# Patient Record
Sex: Male | Born: 2000 | Race: Black or African American | Hispanic: No | Marital: Single | Smoking: Never smoker
Health system: Southern US, Community
[De-identification: ages and names within clinical notes are randomized; demographics above are authoritative.]

---
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
## 2020-06-13 ENCOUNTER — Other Ambulatory Visit: Payer: Self-pay | Admitting: Physician Assistant

## 2020-06-13 ENCOUNTER — Other Ambulatory Visit: Payer: Self-pay | Admitting: Family Medicine

## 2020-06-13 DIAGNOSIS — I429 Cardiomyopathy, unspecified: Secondary | ICD-10-CM

## 2020-06-16 ENCOUNTER — Other Ambulatory Visit: Payer: Self-pay

## 2020-06-16 ENCOUNTER — Ambulatory Visit
Admission: RE | Admit: 2020-06-16 | Discharge: 2020-06-16 | Disposition: A | Discharge status: Home / Self Care | Payer: 59 | Source: Ambulatory Visit | Attending: Family Medicine | Admitting: Family Medicine

## 2020-06-16 DIAGNOSIS — I429 Cardiomyopathy, unspecified: Secondary | ICD-10-CM | POA: Diagnosis present

## 2020-06-16 LAB — ECHOCARDIOGRAM COMPLETE
AR max vel: 2.13 cm2
AV Area VTI: 2.08 cm2
AV Area mean vel: 2 cm2
AV Mean grad: 3 mmHg
AV Peak grad: 4.8 mmHg
Ao pk vel: 1.1 m/s
Area-P 1/2: 4.12 cm2
S' Lateral: 2.87 cm

## 2020-06-16 NOTE — Progress Notes (Signed)
*  PRELIMINARY RESULTS* Echocardiogram Echocardiogram Transesophageal has been performed.  Thomas Randolph 06/16/2020, 11:33 AM

## 2020-07-07 ENCOUNTER — Other Ambulatory Visit: Payer: Self-pay

## 2020-07-07 ENCOUNTER — Ambulatory Visit (INDEPENDENT_AMBULATORY_CARE_PROVIDER_SITE_OTHER): Payer: 59

## 2020-07-07 ENCOUNTER — Ambulatory Visit (INDEPENDENT_AMBULATORY_CARE_PROVIDER_SITE_OTHER): Payer: 59 | Admitting: Cardiology

## 2020-07-07 ENCOUNTER — Encounter: Payer: Self-pay | Admitting: Cardiology

## 2020-07-07 VITALS — BP 114/70 | HR 63 | Ht 72.0 in | Wt 170.0 lb

## 2020-07-07 DIAGNOSIS — R55 Syncope and collapse: Secondary | ICD-10-CM | POA: Diagnosis not present

## 2020-07-07 NOTE — Patient Instructions (Signed)
Medication Instructions:   Your physician recommends that you continue on your current medications as directed. Please refer to the Current Medication list given to you today.  *If you need a refill on your cardiac medications before your next appointment, please call your pharmacy*   Lab Work: None ordered If you have labs (blood work) drawn today and your tests are completely normal, you will receive your results only by: Marland Kitchen MyChart Message (if you have MyChart) OR . A paper copy in the mail If you have any lab test that is abnormal or we need to change your treatment, we will call you to review the results.   Testing/Procedures:  Your physician has recommended that you wear a Zio monitor for 2 weeks This monitor is a medical device that records the heart's electrical activity. Doctors most often use these monitors to diagnose arrhythmias. Arrhythmias are problems with the speed or rhythm of the heartbeat. The monitor is a small device applied to your chest. You can wear one while you do your normal daily activities. While wearing this monitor if you have any symptoms to push the button and record what you felt. Once you have worn this monitor for the period of time provider prescribed (Usually 14 days), you will return the monitor device in the postage paid box. Once it is returned they will download the data collected and provide [place] with a report which the provider will then review and we will call you with those results. Important tips:  1. Avoid showering during the first 24 hours of wearing the monitor. 2. Avoid excessive sweating to help maximize wear time. 3. Do not submerge the device, no hot tubs, and no swimming pools. 4. Keep any lotions or oils away from the patch. 5. After 24 hours you may shower with the patch on. Take brief showers with your back facing the shower head.  6. Do not remove patch once it has been placed because that will interrupt data and decrease adhesive wear  time. 7. Push the button when you have any symptoms and write down what you were feeling. 8. Once you have completed wearing your monitor, remove and place into box which has postage paid and place in your outgoing mailbox.  9. If for some reason you have misplaced your box then call our office and we can provide another box and/or mail it off for you.         Follow-Up: At [facility], you and your health needs are our priority.  As part of our continuing mission to provide you with exceptional heart care, we have created designated Provider Care Teams.  These Care Teams include your primary Cardiologist (physician) and Advanced Practice Providers (APPs -  Physician Assistants and Nurse Practitioners) who all work together to provide you with the care you need, when you need it.  We recommend signing up for the patient portal called "MyChart".  Sign up information is provided on this After Visit Summary.  MyChart is used to connect with patients for Virtual Visits (Telemedicine).  Patients are able to view lab/test results, encounter notes, upcoming appointments, etc.  Non-urgent messages can be sent to your provider as well.   To learn more about what you can do with MyChart, go to [email or web address].    Your next appointment:   Follow up as needed   The format for your next appointment:   In Person  Provider:   Debbe Odea, MD   Other Instructions

## 2020-07-07 NOTE — Progress Notes (Signed)
Cardiology Office Note:    Date:  07/07/2020   ID:  Thomas Randolph, DOB March 03, 2001, MRN [id number]  PCP:  Mickie Bail, MD   [facility]  Cardiologist:  No primary care provider on file.  Advanced Practice Provider:  No care team member to display Electrophysiologist:  None       Referring MD: Christen Bame, PA   Chief Complaint  Patient presents with  . New Patient (Initial Visit)    ED follow up - Chest pain. Meds reviewed verbally with patient.    Thomas Randolph is a 20 y.o. male who is being seen today for the evaluation of chest pain at the request of Christen Bame, [place].   History of Present Illness:    Thomas Randolph is a 20 y.o. male with no significant past medical history who presents due to a syncopal event.  Patient states passing out last month while at home.  He just played basketball for about 2 hours, states not hydrating himself well.  Was sitting and try to get up to grab something when he felt dizzy and knew he was going to pass out.  He states bumping his head onto the wall and then fell.  States not losing consciousness, was aware about everything going on.  Was taken to the ED where work-up was unrevealing.  Diagnosed with vasovagal syncope and dehydration.  Has not had any further episodes since.  He denies any history of heart disease, palpitations, chest pain, shortness of breath.   Echocardiogram 06/16/2020 showed normal systolic and diastolic function, EF 55 to 60%.  History reviewed. No pertinent past medical history.  History reviewed. No pertinent surgical history.  Current Medications: No outpatient medications have been marked as taking for the 07/07/20 encounter (Office Visit) with Debbe Odea, MD.     Allergies:   Patient has no known allergies.   Social History   Socioeconomic History  . Marital status: Single    Spouse name: Not on file  . Number of children: Not on file  . Years of education: Not on file  .  Highest education level: Not on file  Occupational History  . Not on file  Tobacco Use  . Smoking status: Never Smoker  . Smokeless tobacco: Never Used  Substance and Sexual Activity  . Alcohol use: No    Alcohol/week: 0.0 standard drinks  . Drug use: Yes    Types: Marijuana  . Sexual activity: Not on file  Other Topics Concern  . Not on file  Social History Narrative  . Not on file   Social Determinants of Health   Financial Resource Strain: Not on file  Food Insecurity: Not on file  Transportation Needs: Not on file  Physical Activity: Not on file  Stress: Not on file  Social Connections: Not on file     Family History: The patient's family history is not on file.  ROS:   Please see the history of present illness.     All other systems reviewed and are negative.  EKGs/Labs/Other Studies Reviewed:    The following studies were reviewed today:   EKG:  EKG is  ordered today.  The ekg ordered today demonstrates normal sinus rhythm  Recent Labs: No results found for requested labs within last 8760 hours.  Recent Lipid Panel No results found for: CHOL, TRIG, HDL, CHOLHDL, VLDL, LDLCALC, LDLDIRECT   Risk Assessment/Calculations:      Physical Exam:    VS:  BP 114/70 (BP Location: Left Arm, Patient Position: Sitting, Cuff Size: Normal)   Pulse 63   Ht 6' (1.829 m)   Wt 170 lb (77.1 kg)   SpO2 98%   BMI 23.06 kg/m     Wt Readings from Last 3 Encounters:  07/07/20 170 lb (77.1 kg)  09/09/17 165 lb (74.8 kg) (73 %, Z= 0.60)*  11/12/15 153 lb (69.4 kg) (73 %, Z= 0.61)*   * Growth percentiles are based on CDC (Boys, 2-20 Years) data.     GEN:  Well nourished, well developed in no acute distress HEENT: Normal NECK: No JVD; No carotid bruits LYMPHATICS: No lymphadenopathy CARDIAC: RRR, no murmurs, rubs, gallops RESPIRATORY:  Clear to auscultation without rales, wheezing or rhonchi  ABDOMEN: Soft, non-tender, non-distended MUSCULOSKELETAL:  No edema; No  deformity  SKIN: Warm and dry NEUROLOGIC:  Alert and oriented x 3 PSYCHIATRIC:  Normal affect   ASSESSMENT:    1. Syncope and collapse    PLAN:    In order of problems listed above:  1. Patient with syncopal episode, symptoms likely secondary to vasovagal versus dehydration after playing basketball.  Echocardiogram obtained 06/16/2020 was normal with no structural abnormalities.  Has not had any further episodes.  We will place a 2-week cardiac monitor to evaluate any significant arrhythmias.  EKG did not reveal any significant arrhythmias, orthostatic vitals in the office today were negative for orthostasis.    If cardiac monitor is normal, patient will follow up as needed.  If there are any abnormalities, we will schedule a follow-up visit.       Medication Adjustments/Labs and Tests Ordered: Current medicines are reviewed at length with the patient today.  Concerns regarding medicines are outlined above.  Orders Placed This Encounter  Procedures  . LONG TERM MONITOR (3-14 DAYS)  . EKG 12-Lead   No orders of the defined types were placed in this encounter.   Patient Instructions  Medication Instructions:   Your physician recommends that you continue on your current medications as directed. Please refer to the Current Medication list given to you today.  *If you need a refill on your cardiac medications before your next appointment, please call your pharmacy*   Lab Work: None ordered If you have labs (blood work) drawn today and your tests are completely normal, you will receive your results only by: Marland Kitchen MyChart Message (if you have MyChart) OR . A paper copy in the mail If you have any lab test that is abnormal or we need to change your treatment, we will call you to review the results.   Testing/Procedures:  Your physician has recommended that you wear a Zio monitor for 2 weeks This monitor is a medical device that records the heart's electrical activity. Doctors most  often use these monitors to diagnose arrhythmias. Arrhythmias are problems with the speed or rhythm of the heartbeat. The monitor is a small device applied to your chest. You can wear one while you do your normal daily activities. While wearing this monitor if you have any symptoms to push the button and record what you felt. Once you have worn this monitor for the period of time provider prescribed (Usually 14 days), you will return the monitor device in the postage paid box. Once it is returned they will download the data collected and provide [place] with a report which the provider will then review and we will call you with those results. Important tips:  1. Avoid showering during the first 24  hours of wearing the monitor. 2. Avoid excessive sweating to help maximize wear time. 3. Do not submerge the device, no hot tubs, and no swimming pools. 4. Keep any lotions or oils away from the patch. 5. After 24 hours you may shower with the patch on. Take brief showers with your back facing the shower head.  6. Do not remove patch once it has been placed because that will interrupt data and decrease adhesive wear time. 7. Push the button when you have any symptoms and write down what you were feeling. 8. Once you have completed wearing your monitor, remove and place into box which has postage paid and place in your outgoing mailbox.  9. If for some reason you have misplaced your box then call our office and we can provide another box and/or mail it off for you.         Follow-Up: At [facility], you and your health needs are our priority.  As part of our continuing mission to provide you with exceptional heart care, we have created designated Provider Care Teams.  These Care Teams include your primary Cardiologist (physician) and Advanced Practice Providers (APPs -  Physician Assistants and Nurse Practitioners) who all work together to provide you with the care you need, when you need it.  We  recommend signing up for the patient portal called "MyChart".  Sign up information is provided on this After Visit Summary.  MyChart is used to connect with patients for Virtual Visits (Telemedicine).  Patients are able to view lab/test results, encounter notes, upcoming appointments, etc.  Non-urgent messages can be sent to your provider as well.   To learn more about what you can do with MyChart, go to [email or web address].    Your next appointment:   Follow up as needed   The format for your next appointment:   In Person  Provider:   Debbe Odea, MD   Other Instructions     Signed, Debbe Odea, MD  07/07/2020 12:44 PM    [facility]

## 2020-08-03 ENCOUNTER — Telehealth: Payer: Self-pay

## 2020-08-03 NOTE — Telephone Encounter (Signed)
Margrett Rud, [place]  08/03/2020 12:00 PM EDT Back to Top     Attempted to call results to patient.  No answer. LMOV.  Phone note started.    Debbe Odea, MD  08/02/2020 4:54 PM EDT      No significant arrhythmias to explain syncopal episode. Syncope was likely due to dehydration from exercising/playing basketball. Follow-up as needed.

## 2021-03-22 ENCOUNTER — Encounter: Payer: Self-pay | Admitting: Emergency Medicine

## 2021-03-22 ENCOUNTER — Ambulatory Visit
Admission: EM | Admit: 2021-03-22 | Discharge: 2021-03-22 | Disposition: A | Discharge status: Home / Self Care | Payer: 59

## 2021-03-22 DIAGNOSIS — B349 Viral infection, unspecified: Secondary | ICD-10-CM | POA: Diagnosis not present

## 2021-03-22 NOTE — Discharge Instructions (Addendum)
Your COVID and Flu tests are pending.  You should self quarantine until the test results are back.    Continue taking the Tamiflu.  Take Tylenol or ibuprofen as needed for fever or discomfort.  Rest and keep yourself hydrated.    Follow-up with your primary care provider if your symptoms are not improving.

## 2021-03-22 NOTE — ED Provider Notes (Signed)
Roderic Palau    CSN: [id number] Arrival date & time: 03/22/21  1709      History   Chief Complaint Chief Complaint  Patient presents with   Fever   Back Pain   Headache    HPI Thomas Randolph is a 20 y.o. male.  Accompanied by his mother, patient presents with fever, chills, headache, body aches, back pain.  T-max 101.  No rash, congestion, cough, shortness of breath, vomiting, diarrhea, dysuria, or other symptoms.  No falls or injury.  Patient had an e-visit 2 days ago and was prescribed Tamiflu; he has taken 3 doses.  He was seen at another clinic yesterday and was prescribed methocarbamol.  Additional treatment at home with NyQuil.  He denies other pertinent medical history.   The history is provided by the patient and a parent.   History reviewed. No pertinent past medical history.  There are no problems to display for this patient.   History reviewed. No pertinent surgical history.     Home Medications    Prior to Admission medications   Medication Sig Start Date End Date Taking? Authorizing Provider  methocarbamol (ROBAXIN) 500 MG tablet Take 500 mg by mouth 4 (four) times daily.   Yes [provider]  oseltamivir (TAMIFLU) 75 MG capsule Take 75 mg by mouth.   Yes [provider]    Family History History reviewed. No pertinent family history.  Social History Social History   Tobacco Use   Smoking status: Never   Smokeless tobacco: Never  Vaping Use   Vaping Use: Never used  Substance Use Topics   Alcohol use: No    Alcohol/week: 0.0 standard drinks   Drug use: Yes    Types: Marijuana     Allergies   Patient has no known allergies.   Review of Systems Review of Systems  Constitutional:  Positive for chills and fever.  HENT:  Negative for ear pain and sore throat.   Respiratory:  Negative for cough and shortness of breath.   Cardiovascular:  Negative for chest pain and palpitations.  Gastrointestinal:  Negative for  abdominal pain, diarrhea and vomiting.  Genitourinary:  Negative for dysuria, flank pain and hematuria.  Musculoskeletal:  Positive for back pain. Negative for arthralgias.  Skin:  Negative for color change and rash.  Neurological:  Positive for headaches. Negative for dizziness and syncope.  All other systems reviewed and are negative.   Physical Exam Triage Vital Signs ED Triage Vitals  Enc Vitals Group     BP 03/22/21 1721 123/78     Pulse Rate 03/22/21 1721 68     Resp 03/22/21 1721 18     Temp 03/22/21 1721 98.1 F (36.7 C)     Temp Source 03/22/21 1721 Oral     SpO2 03/22/21 1721 98 %     Weight --      Height --      Head Circumference --      Peak Flow --      Pain Score 03/22/21 1727 8     Pain Loc --      Pain Edu? --      Excl. in [place]? --    No data found.  Updated Vital Signs BP 123/78 (BP Location: Left Arm)   Pulse 68   Temp 98.1 F (36.7 C) (Oral)   Resp 18   SpO2 98%   Visual Acuity Right Eye Distance:   Left Eye Distance:   Bilateral Distance:  Right Eye Near:   Left Eye Near:    Bilateral Near:     Physical Exam Vitals and nursing note reviewed.  Constitutional:      General: He is not in acute distress.    Appearance: Normal appearance. He is well-developed. He is not ill-appearing.  HENT:     Right Ear: Tympanic membrane normal.     Left Ear: Tympanic membrane normal.     Nose: Nose normal.     Mouth/Throat:     Mouth: Mucous membranes are moist.     Pharynx: Oropharynx is clear.  Cardiovascular:     Rate and Rhythm: Normal rate and regular rhythm.     Heart sounds: Normal heart sounds.  Pulmonary:     Effort: Pulmonary effort is normal. No respiratory distress.     Breath sounds: Normal breath sounds.  Abdominal:     General: Bowel sounds are normal.     Palpations: Abdomen is soft.     Tenderness: There is no abdominal tenderness. There is no right CVA tenderness, left CVA tenderness, guarding or rebound.  Musculoskeletal:      Cervical back: Neck supple.  Skin:    General: Skin is warm and dry.  Neurological:     Mental Status: He is alert.  Psychiatric:        Mood and Affect: Mood normal.        Behavior: Behavior normal.     UC Treatments / Results  Labs (all labs ordered are listed, but only abnormal results are displayed) Labs Reviewed  COVID-19, FLU A+B NAA    EKG   Radiology No results found.  Procedures Procedures (including critical care time)  Medications Ordered in UC Medications - No data to display  Initial Impression / Assessment and Plan / UC Course  I have reviewed the triage vital signs and the nursing notes.  Pertinent labs & imaging results that were available during my care of the patient were reviewed by me and considered in my medical decision making (see chart for details).   Viral illness.  COVID and Flu pending.  Instructed patient to self quarantine per CDC guidelines.  Instructed him to continue the Tamiflu as directed until his test results are back.  Discussed symptomatic treatment including Tylenol or ibuprofen, rest, hydration.  Instructed patient to follow up with PCP if symptoms are not improving.  Patient agrees to plan of care.    Final Clinical Impressions(s) / UC Diagnoses   Final diagnoses:  Viral illness     Discharge Instructions      Your COVID and Flu tests are pending.  You should self quarantine until the test results are back.    Continue taking the Tamiflu.  Take Tylenol or ibuprofen as needed for fever or discomfort.  Rest and keep yourself hydrated.    Follow-up with your primary care provider if your symptoms are not improving.         ED Prescriptions   None    PDMP not reviewed this encounter.   Mickie Bail, NP 03/22/21 [phone]

## 2021-03-22 NOTE — ED Triage Notes (Signed)
Pt c/o lower back pain, fever, and HA x 3 days

## 2021-03-23 LAB — COVID-19, FLU A+B NAA
Influenza A, NAA: NOT DETECTED
Influenza B, NAA: NOT DETECTED
SARS-CoV-2, NAA: DETECTED — AB

## 2021-12-06 ENCOUNTER — Encounter: Payer: Self-pay | Admitting: *Deleted

## 2021-12-06 NOTE — Progress Notes (Signed)
Cardiology Office Note:    Date:  12/07/2021   ID:  Thomas Randolph, DOB [birth year], MRN [id number]  PCP:  Mickie Bail, MD  [facility] Cardiologist:  None  [facility] Electrophysiologist:  None   Referring MD: Mickie Bail, MD   Chief Complaint:  1 year follow-up  History of Present Illness:    Thomas Randolph is a 21 y.o. male with a hx of pre-syncope who presents for 1 year follow-up.   Patient was seen 06/2020 as a New patient for pre-syncope, passing out after playing basketball for 2 hours. Prior ER work-up was negative. Suspected vasovagal vs dehydration. Heart monitor was ordered. Heart monitor showed NSR with rare ectopy. Echo 06/16/20 showed normal LVEF and diastolic function, EF 55-60%.  The patient reports he is overall doing well. He has a boot on the left foot for plantar fascitis, he will wear it for another 2 weeks. The patient reports occasional chest pain, it is sharp and the left side. Can last a few minutes. This is new, it started a month ago. No pain with inspiration. This occurs mainly when he is running, but it's not consistent. Grandpa had open heart surgery in his 8s. He has occasional dizziness after exertion or practice.   History reviewed. No pertinent past medical history.  History reviewed. No pertinent surgical history.  Current Medications: Current Meds  Medication Sig   meloxicam (MOBIC) 15 MG tablet Take 1 tablet every day by oral route for 14 days.     Allergies:   Patient has no known allergies.   Social History   Socioeconomic History   Marital status: Single    Spouse name: Not on file   Number of children: Not on file   Years of education: Not on file   Highest education level: Not on file  Occupational History   Not on file  Tobacco Use   Smoking status: Never   Smokeless tobacco: Never  Vaping Use   Vaping Use: Never used  Substance and Sexual Activity   Alcohol use: No    Alcohol/week: 0.0 standard drinks of  alcohol   Drug use: Yes    Types: Marijuana   Sexual activity: Not on file  Other Topics Concern   Not on file  Social History Narrative   Not on file   Social Determinants of Health   Financial Resource Strain: Not on file  Food Insecurity: Not on file  Transportation Needs: Not on file  Physical Activity: Not on file  Stress: Not on file  Social Connections: Not on file     Family History: The patient's family history is not on file.  ROS:   Please see the history of present illness.     All other systems reviewed and are negative.  EKGs/Labs/Other Studies Reviewed:    The following studies were reviewed today:  Echo 06/2020  1. Left ventricular ejection fraction, by estimation, is 55 to 60%. The  left ventricle has normal function. The left ventricle has no regional  wall motion abnormalities. Left ventricular diastolic parameters were  normal. The average left ventricular  global longitudinal strain is -18.3 %. The global longitudinal strain is  normal.   2. Right ventricular systolic function is normal. The right ventricular  size is normal.   EKG:  EKG is  ordered today.  The ekg ordered today demonstrates NSR 67bpm, nonspecific T wave changes.  Recent Labs: No results found for requested labs within last 365 days.  Recent Lipid Panel No results found for: "CHOL", "TRIG", "HDL", "CHOLHDL", "VLDL", "LDLCALC", "LDLDIRECT"    Physical Exam:    VS:  BP 114/78 (BP Location: Left Arm, Patient Position: Sitting, Cuff Size: Normal)   Pulse 67   Ht 5\' 10"  (1.778 m)   Wt 168 lb (76.2 kg)   SpO2 98%   BMI 24.11 kg/m     Wt Readings from Last 3 Encounters:  12/07/21 168 lb (76.2 kg)  07/07/20 170 lb (77.1 kg)  09/09/17 165 lb (74.8 kg) (73 %, Z= 0.60)*   * Growth percentiles are based on CDC (Boys, 2-20 Years) data.     GEN:  Well nourished, well developed in no acute distress HEENT: Normal NECK: No JVD; No carotid bruits LYMPHATICS: No  lymphadenopathy CARDIAC: RRR, no murmurs, rubs, gallops RESPIRATORY:  Clear to auscultation without rales, wheezing or rhonchi  ABDOMEN: Soft, non-tender, non-distended MUSCULOSKELETAL:  No edema; No deformity  SKIN: Warm and dry NEUROLOGIC:  Alert and oriented x 3 PSYCHIATRIC:  Normal affect   ASSESSMENT:    1. Chest pain of uncertain etiology   2. Pre-syncope    PLAN:    In order of problems listed above:  Chest Pain Patient reports occasional sharp chest pain when he runs. It does not happen all the time. He is tender to palpation on exam. EKG with no changes. He is very active at baseline. He does report family history of CAD, otherwise no other RF. I suspect MSK. We did discuss treadmill stress test but he is in a boot for plantar fascitis. We will see him back in 3 months to evaluate symptoms and discuss further testing if needed.   Pre-syncope Patient has occasional dizziness after severe exertion. He takes breaks as needed.   Disposition: Follow up in 3 month(s) with MD/APP    Signed, Efrata Brunner 09/11/17, PA-C  12/07/2021 9:34 AM    [facility]

## 2021-12-07 ENCOUNTER — Encounter: Payer: Self-pay | Admitting: Medical

## 2021-12-07 ENCOUNTER — Ambulatory Visit (INDEPENDENT_AMBULATORY_CARE_PROVIDER_SITE_OTHER): Payer: 59 | Admitting: Medical

## 2021-12-07 VITALS — BP 114/78 | HR 67 | Ht 70.0 in | Wt 168.0 lb

## 2021-12-07 DIAGNOSIS — R55 Syncope and collapse: Secondary | ICD-10-CM

## 2021-12-07 DIAGNOSIS — R079 Chest pain, unspecified: Secondary | ICD-10-CM

## 2021-12-07 NOTE — Patient Instructions (Addendum)
Medication Instructions:  Your physician recommends that you continue on your current medications as directed. Please refer to the Current Medication list given to you today.  *If you need a refill on your cardiac medications before your next appointment, please call your pharmacy*   Lab Work: None ordered  If you have labs (blood work) drawn today and your tests are completely normal, you will receive your results only by: MyChart Message (if you have MyChart) OR A paper copy in the mail If you have any lab test that is abnormal or we need to change your treatment, we will call you to review the results.   Testing/Procedures: None ordered  Follow-Up: At [facility], you and your health needs are our priority.  As part of our continuing mission to provide you with exceptional heart care, we have created designated Provider Care Teams.  These Care Teams include your primary Cardiologist (physician) and Advanced Practice Providers (APPs -  Physician Assistants and Nurse Practitioners) who all work together to provide you with the care you need, when you need it.  We recommend signing up for the patient portal called "MyChart".  Sign up information is provided on this After Visit Summary.  MyChart is used to connect with patients for Virtual Visits (Telemedicine).  Patients are able to view lab/test results, encounter notes, upcoming appointments, etc.  Non-urgent messages can be sent to your provider as well.   To learn more about what you can do with MyChart, go to [email or web address].    Your next appointment:   3 month(s)  The format for your next appointment:   In Person  Provider:   You may see Dr. Debbe Odea or one of the following Advanced Practice Providers on your designated Care Team:   Nicolasa Ducking, NP Eula Listen, PA-C Cadence Fransico Jovanie, PA-C{  Important Information About Sugar

## 2022-01-16 ENCOUNTER — Ambulatory Visit (INDEPENDENT_AMBULATORY_CARE_PROVIDER_SITE_OTHER): Payer: 59 | Admitting: Podiatry

## 2022-01-16 ENCOUNTER — Ambulatory Visit (INDEPENDENT_AMBULATORY_CARE_PROVIDER_SITE_OTHER): Payer: 59

## 2022-01-16 DIAGNOSIS — R937 Abnormal findings on diagnostic imaging of other parts of musculoskeletal system: Secondary | ICD-10-CM | POA: Diagnosis not present

## 2022-01-16 DIAGNOSIS — S99922A Unspecified injury of left foot, initial encounter: Secondary | ICD-10-CM

## 2022-01-16 DIAGNOSIS — M84375A Stress fracture, left foot, initial encounter for fracture: Secondary | ICD-10-CM | POA: Diagnosis not present

## 2022-01-16 MED ORDER — METHYLPREDNISOLONE 4 MG PO TBPK: ORAL_TABLET | ORAL | 0 refills | Status: DC

## 2022-01-17 ENCOUNTER — Telehealth: Payer: Self-pay | Admitting: Podiatry

## 2022-01-17 ENCOUNTER — Encounter: Payer: Self-pay | Admitting: Podiatry

## 2022-01-17 DIAGNOSIS — R937 Abnormal findings on diagnostic imaging of other parts of musculoskeletal system: Secondary | ICD-10-CM

## 2022-01-17 DIAGNOSIS — M722 Plantar fascial fibromatosis: Secondary | ICD-10-CM

## 2022-01-17 NOTE — Telephone Encounter (Signed)
Pts sister is calling to see if we can send over a prior authorization for MRI appt scheduled for tomorrow 10/6.   Please advise.

## 2022-01-17 NOTE — Progress Notes (Signed)
  Subjective:  Patient ID: Thomas Randolph, male    DOB: July 01, 2000,  MRN: [id number]  Chief Complaint  Patient presents with   Foot Pain    Injured foot over the summer has been in the boot for about a month and a half    21 y.o. male presents with the above complaint. History confirmed with patient.  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  He felt a sharp pain in his foot and was evaluated by the trainer he ended up at [facility] and they evaluated him for heel pain and planter fasciitis.  Recommended immobilization and physical therapy in a boot.  He has not improved with this and an MRI was ordered.  He will get the CD to me today.  Objective:  Physical Exam: warm, good capillary refill, no trophic changes or ulcerative lesions, normal DP and PT pulses, normal sensory exam, and pain with lateral compression of the calcaneus, no direct pain on the insertion of the plantar fascia or mid band of the plantar fascia, no pain in the Achilles, no ecchymosis bruising or discontinuity of tendons   Radiographs: Multiple views x-ray of the left foot: Radiographs taken today show no acute fracture or dislocation, in the area of Kager's triangle there is some radiolucency  MRI at [facility] 01/02/2022 CONCLUSION: 1. Ill-defined thickened anterior talofibular ligament, calcaneofibular ligament and less so deltoid ligament. Sprain, scarring and capsulitis are present. Clinical correlation will be necessary to assess and confirm functional stability. 2. Mild Achilles tendon tendinopathy and peritendinitis. No tear. 3. Edema changes within the mid and posteromedial talus. Low-grade stress change or contusion present. 4. No fracture, AVN or mass. Thank you for the opportunity to provide your interpretation. Libby Maw, MD AJannifer Rodney 01/02/2022 2:09 PM  Assessment:   1. Bone marrow edema   2. Foot injury, left,  initial encounter   3. Stress fracture of left calcaneus      Plan:  Patient was evaluated and treated and all questions answered.  I evaluated and treated the patient, we reviewed his radiographs and his progress.  Initially I agreed with the read on the MRI report discussed that likely it is resolving plantar fasciitis and his treatment plan so far.  He was able to get me the MRI CD today and I was able to independently review it.  I do think that on the axial views there is a small discontinuity in the wall of the calcaneus and an area of hyperintensity on the T2 signal I suspect this is likely a stress fracture developing into a full fracture.  I called him to messages and I recommend we follow-up with a CT scan that has better resolution the current MRI is rather poor resolution.  For now advised avoid impact activity and hold off on practice for now continue WBAT in boot I will let him know further treatment options once I have the CT scan completed.  Rx for methylprednisolone sent to pharmacy for anti-inflammatory, he will hold off on using this until we have the CT scan  Return in about 4 weeks (around 02/13/2022) for follow up left foot heel pain.

## 2022-01-18 ENCOUNTER — Other Ambulatory Visit: Payer: 59

## 2022-01-21 ENCOUNTER — Telehealth: Payer: Self-pay | Admitting: *Deleted

## 2022-01-21 NOTE — Telephone Encounter (Signed)
Patient's mother is calling for status of the CT scan approval,please contact mother once approved.

## 2022-01-23 ENCOUNTER — Ambulatory Visit
Admission: RE | Admit: 2022-01-23 | Discharge: 2022-01-23 | Disposition: A | Discharge status: Home / Self Care | Payer: 59 | Source: Ambulatory Visit | Attending: Podiatry | Admitting: Podiatry

## 2022-01-23 DIAGNOSIS — M84375A Stress fracture, left foot, initial encounter for fracture: Secondary | ICD-10-CM

## 2022-01-23 DIAGNOSIS — R937 Abnormal findings on diagnostic imaging of other parts of musculoskeletal system: Secondary | ICD-10-CM

## 2022-01-24 NOTE — Telephone Encounter (Signed)
Patient MRI was completed yesterday.

## 2022-01-29 ENCOUNTER — Encounter: Payer: Self-pay | Admitting: Podiatry

## 2022-01-30 ENCOUNTER — Telehealth: Payer: Self-pay | Admitting: Podiatry

## 2022-01-30 NOTE — Telephone Encounter (Signed)
Called the patient to review results of CT scan.  I also discussed with Dr. Genene Churn with radiology.  Recommendations noted in the imaging section

## 2022-02-04 ENCOUNTER — Encounter: Payer: Self-pay | Admitting: Podiatry

## 2022-02-04 ENCOUNTER — Ambulatory Visit (INDEPENDENT_AMBULATORY_CARE_PROVIDER_SITE_OTHER): Payer: 59 | Admitting: Podiatry

## 2022-02-04 DIAGNOSIS — M722 Plantar fascial fibromatosis: Secondary | ICD-10-CM | POA: Diagnosis not present

## 2022-02-04 NOTE — Progress Notes (Signed)
  Subjective:  Patient ID: Thomas Randolph, male    DOB: 12-07-2000,  MRN: [id number]  Chief Complaint  Patient presents with   Foot Pain      follow up 4 weeks left foot heel pain- Injection    21 y.o. male presents with the above complaint. History confirmed with patient.  Has not improved much with the methylprednisolone taper was helpful.  He is still taking meloxicam.  He completed the CT scan which we have previously discussed on the phone Objective:  Physical Exam: warm, good capillary refill, no trophic changes or ulcerative lesions, normal DP and PT pulses, normal sensory exam, and pain with lateral compression of the calcaneus, no direct pain on the insertion of the plantar fascia or mid band of the plantar fascia, no pain in the Achilles, no ecchymosis bruising or discontinuity of tendons   Radiographs: Multiple views x-ray of the left foot: Radiographs taken today show no acute fracture or dislocation, in the area of Kager's triangle there is some radiolucency  MRI at [facility] 01/02/2022 CONCLUSION: 1. Ill-defined thickened anterior talofibular ligament, calcaneofibular ligament and less so deltoid ligament. Sprain, scarring and capsulitis are present. Clinical correlation will be necessary to assess and confirm functional stability. 2. Mild Achilles tendon tendinopathy and peritendinitis. No tear. 3. Edema changes within the mid and posteromedial talus. Low-grade stress change or contusion present. 4. No fracture, AVN or mass. Thank you for the opportunity to provide your interpretation. Libby Maw, MD A: Jannifer Rodney 01/02/2022 2:09 PM   CT scan 01/23/2022 IMPRESSION: 1. No evidence of acute fracture or dislocation. 2. Small osseous fragment about the inferior aspect of the medial malleolus, likely sequela of prior trauma. 3. Muscles and tendons are intact.     Electronically Signed   By: Keane Police D.O.   On: 01/23/2022 16:39 Assessment:   1. Plantar fasciitis of left  foot      Plan:  Patient was evaluated and treated and all questions answered.  We again reviewed his clinical progress.  I think at this point he should be able to continue with his planned season although he will continue to deal with this for some time.  He starts physical therapy this week and I think this will be very beneficial.  We did discuss and I recommended a corticosteroid injection to reduce inflammation and pain.  He will continue WBAT in the cam boot with the exception of practice and games.  Hopefully can transition out of this after the next visit.  Discussed the risk and benefits of the corticosteroid injection.  Following sterile prep with alcohol, 4 mg of dexamethasone and 0.5 cc each of 2% lidocaine and 0.5% Marcaine plain was injected along the medial and central band of the plantar fascia at the insertion on the calcaneus from medial approach.  He tolerated this well.  Return in about 30 days (around 03/06/2022) for recheck plantar fasciitis.

## 2022-02-13 ENCOUNTER — Ambulatory Visit: Payer: 59 | Admitting: Podiatry

## 2022-03-01 ENCOUNTER — Encounter: Payer: Self-pay | Admitting: Cardiology

## 2022-03-01 ENCOUNTER — Ambulatory Visit: Payer: 59 | Attending: Cardiology | Admitting: Cardiology

## 2022-03-01 VITALS — BP 104/60 | HR 64 | Ht 70.0 in | Wt 174.4 lb

## 2022-03-01 DIAGNOSIS — R55 Syncope and collapse: Secondary | ICD-10-CM

## 2022-03-01 DIAGNOSIS — F1290 Cannabis use, unspecified, uncomplicated: Secondary | ICD-10-CM

## 2022-03-01 NOTE — Progress Notes (Signed)
Cardiology Office Note:    Date:  03/01/2022   ID:  Thomas Randolph, DOB 07-09-2000, MRN [id number]  PCP:  Thomas Gauss, MD (Inactive)   [place]  Cardiologist:  None  Advanced Practice Provider:  No care team member to display Electrophysiologist:  None       Referring MD: Thomas Gauss, MD   Chief Complaint  Patient presents with   Follow-up    3 month f/u, no new cardiac concerns     History of Present Illness:    Thomas Randolph is a 21 y.o. male with no significant past medical history who presents for follow-up.  He was previously seen due to a syncopal event.    Previous echocardiogram showed normal systolic function, cardiac monitor normal showed no significant arrhythmias.  Patient denies any further episodes of syncope.  Previous syncopal event occurred in the context of playing basketball.  Etiology possibly dehydration or vasovagal.  Prior notes Echocardiogram 06/16/2020 showed normal systolic and diastolic function, EF 55 to 60%.  History reviewed. No pertinent past medical history.  History reviewed. No pertinent surgical history.  Current Medications: Current Meds  Medication Sig   methylPREDNISolone (MEDROL DOSEPAK) 4 MG TBPK tablet 6 day dose pack - take as directed     Allergies:   Patient has no known allergies.   Social History   Socioeconomic History   Marital status: Single    Spouse name: Not on file   Number of children: Not on file   Years of education: Not on file   Highest education level: Not on file  Occupational History   Not on file  Tobacco Use   Smoking status: Never   Smokeless tobacco: Never  Vaping Use   Vaping Use: Never used  Substance and Sexual Activity   Alcohol use: No    Alcohol/week: 0.0 standard drinks of alcohol   Drug use: Yes    Types: Marijuana   Sexual activity: Not on file  Other Topics Concern   Not on file  Social History Narrative   Not on file   Social  Determinants of Health   Financial Resource Strain: Not on file  Food Insecurity: Not on file  Transportation Needs: Not on file  Physical Activity: Not on file  Stress: Not on file  Social Connections: Not on file     Family History: The patient's family history is not on file.  ROS:   Please see the history of present illness.     All other systems reviewed and are negative.  EKGs/Labs/Other Studies Reviewed:    The following studies were reviewed today:   EKG:  EKG not ordered today.    Recent Labs: No results found for requested labs within last 365 days.  Recent Lipid Panel No results found for: "CHOL", "TRIG", "HDL", "CHOLHDL", "VLDL", "LDLCALC", "LDLDIRECT"   Risk Assessment/Calculations:      Physical Exam:    VS:  BP 104/60 (BP Location: Left Arm, Patient Position: Sitting, Cuff Size: Normal)   Pulse 64   Ht 5\' 10"  (1.778 m)   Wt 174 lb 6.4 oz (79.1 kg)   SpO2 100%   BMI 25.02 kg/m     Wt Readings from Last 3 Encounters:  03/01/22 174 lb 6.4 oz (79.1 kg)  12/07/21 168 lb (76.2 kg)  07/07/20 170 lb (77.1 kg)     GEN:  Well nourished, well developed in no acute distress HEENT: Normal NECK: No JVD; No  carotid bruits CARDIAC: RRR, no murmurs, rubs, gallops RESPIRATORY:  Clear to auscultation without rales, wheezing or rhonchi  ABDOMEN: Soft, non-tender, non-distended MUSCULOSKELETAL:  No edema; No deformity  SKIN: Warm and dry NEUROLOGIC:  Alert and oriented x 3 PSYCHIATRIC:  Normal affect   ASSESSMENT:    1. Syncope and collapse   2. Marijuana smoker    PLAN:    In order of problems listed above:  Patient with syncopal episode, symptoms likely secondary to vasovagal versus dehydration after playing basketball.  Echocardiogram obtained 06/16/2020 was normal with no structural abnormalities.  Previous orthostatic vitals with no evidence for orthostasis.  Cardiac monitor unrevealing, no significant arrhythmias. Marijuana smoking, cessation  advised.  Follow-up as needed  Medication Adjustments/Labs and Tests Ordered: Current medicines are reviewed at length with the patient today.  Concerns regarding medicines are outlined above.  No orders of the defined types were placed in this encounter.  No orders of the defined types were placed in this encounter.   There are no Patient Instructions on file for this visit.   Signed, Debbe Odea, MD  03/01/2022 9:38 AM    [facility]

## 2022-03-01 NOTE — Patient Instructions (Signed)
Medication Instructions:   Your physician recommends that you continue on your current medications as directed. Please refer to the Current Medication list given to you today.   *If you need a refill on your cardiac medications before your next appointment, please call your pharmacy*    Follow-Up: At [facility], you and your health needs are our priority.  As part of our continuing mission to provide you with exceptional heart care, we have created designated Provider Care Teams.  These Care Teams include your primary Cardiologist (physician) and Advanced Practice Providers (APPs -  Physician Assistants and Nurse Practitioners) who all work together to provide you with the care you need, when you need it.  We recommend signing up for the patient portal called "MyChart".  Sign up information is provided on this After Visit Summary.  MyChart is used to connect with patients for Virtual Visits (Telemedicine).  Patients are able to view lab/test results, encounter notes, upcoming appointments, etc.  Non-urgent messages can be sent to your provider as well.   To learn more about what you can do with MyChart, go to [email or web address].    Your next appointment:   Follow up as needed   The format for your next appointment:   In Person  Provider:   You may see Brian Agbor-Etang, MD or one of the following Advanced Practice Providers on your designated Care Team:   Christopher Berge, NP Ryan Dunn, PA-C Cadence Furth, PA-C Sheri Hammock, NP    Other Instructions   Important Information About Sugar       

## 2022-03-06 ENCOUNTER — Encounter: Payer: Self-pay | Admitting: Podiatry

## 2022-03-06 ENCOUNTER — Ambulatory Visit (INDEPENDENT_AMBULATORY_CARE_PROVIDER_SITE_OTHER): Payer: 59 | Admitting: Podiatry

## 2022-03-06 DIAGNOSIS — M25372 Other instability, left ankle: Secondary | ICD-10-CM | POA: Diagnosis not present

## 2022-03-06 DIAGNOSIS — M722 Plantar fascial fibromatosis: Secondary | ICD-10-CM | POA: Diagnosis not present

## 2022-03-06 NOTE — Progress Notes (Signed)
  Subjective:  Patient ID: Thomas Randolph, male    DOB: 21-Mar-2001,  MRN: [id number]  Chief Complaint  Patient presents with   Plantar Fasciitis    Follow up left foot    21 y.o. male presents with the above complaint. History confirmed with patient.  He is doing much better he says he is not having any pain.  The injection was very helpful.  He has rolled his ankle a couple times he has had problems with ankle stability before.  The trainers are taping his ankles before the games.  Objective:  Physical Exam: warm, good capillary refill, no trophic changes or ulcerative lesions, normal DP and PT pulses, normal sensory exam, no pain on compression of the heels today.  He has mild tenderness over the CFL, no instability no talar tilt or anterior drawer noted no ecchymosis edema or bruising the lateral ankle  Radiographs: Multiple views x-ray of the left foot: Radiographs taken today show no acute fracture or dislocation, in the area of Kager's triangle there is some radiolucency  MRI at [facility] 01/02/2022 CONCLUSION: 1. Ill-defined thickened anterior talofibular ligament, calcaneofibular ligament and less so deltoid ligament. Sprain, scarring and capsulitis are present. Clinical correlation will be necessary to assess and confirm functional stability. 2. Mild Achilles tendon tendinopathy and peritendinitis. No tear. 3. Edema changes within the mid and posteromedial talus. Low-grade stress change or contusion present. 4. No fracture, AVN or mass. Thank you for the opportunity to provide your interpretation. Laqueta Due, MD A: Thea Silversmith 01/02/2022 2:09 PM   CT scan 01/23/2022 IMPRESSION: 1. No evidence of acute fracture or dislocation. 2. Small osseous fragment about the inferior aspect of the medial malleolus, likely sequela of prior trauma. 3. Muscles and tendons are intact.     Electronically Signed   By: Larose Hires D.O.   On: 01/23/2022 16:39 Assessment:   1. Plantar fasciitis  of left foot   2. Ankle instability, left      Plan:  Patient was evaluated and treated and all questions answered.  Overall very well.  I think he can transition away from the cam boot back to regular supportive shoe gear.  I do agree with taping his ankles before games for stability.  I discussed physical therapy to treat this as well and he will continue this for his Planter fasciitis as well as the instability.  A secondary order to add to stabilization exercises was sent to [facility] in [place].  Home exercises were also given that he will do twice daily at home.  Hopefully should be able to continue his season at this point.  I will see him back in 2 months for follow-up Return in about 2 months (around 05/06/2022) for recheck plantar fasciitis, ankle stability .

## 2022-03-06 NOTE — Patient Instructions (Signed)
 Chronic Ankle Instability & Rehab Chronic Ankle Instability Chronic ankle instability is a condition that makes the ankle weak and more likely to give way. The condition is common among athletes, especially those with prior ankle ligament injury. Ligaments are strong tissues that connectbones to each other. What are the causes?  This condition is caused by multiple ankle sprains that have not healedproperly, leaving the ankle ligaments loose or damaged. What increases the risk? This condition is more likely to develop in people who participate in sports in which there is a risk of spraining an ankle. These sports include: Cross-country trail running. Basketball. Baseball. Tennis. Football. Soccer. What are the signs or symptoms? Symptoms of this condition include: Rolling your ankle repeatedly. Swelling. Pain. Bruising. Tenderness. Feeling wobbly or unsteady on your foot. Difficulty walking on uneven surfaces or in the dark. How is this diagnosed? This condition may be diagnosed based on: Your symptoms. Your medical history. A physical exam. Your health care provider will check your balance, strength, and range of motion. He or she will also check your injured ankle against your healthy ankle. Imaging tests, such as: An X-ray. A CT scan. An MRI. An ultrasound. How is this treated? Treatment for this condition may include: Wearing a removable boot, brace, or splint. Wearing supportive shoes or shoe inserts. Applying ice to the ankle to reduce swelling. Taking anti-inflammatory pain medicine. Doing exercises (physical therapy). Not putting any body weight, or putting only limited body weight, on your ankle for several days. Gradually returning to full activity. Surgery to repair damaged ligaments. Usually, surgery is needed only if the condition is severe or if othertreatments do not work. Follow these instructions at home: If you have a boot, brace, or splint: Wear it  as told by your health care provider. Remove it only as told by your health care provider. Loosen it if your toes tingle, become numb, or turn cold and blue. Keep it clean. If it is not waterproof: Do not let it get wet. Cover it with a watertight covering when you take a bath or a shower. Ask your health care provider when it is safe to drive with a boot, brace, or splint on your foot. Managing pain, stiffness, and swelling  If directed, put ice on the injured area. If you have a removable boot, brace, or splint, remove it as told by your health care provider. Put ice in a plastic bag. Place a towel between your skin and the bag. Leave the ice on for 20 minutes, 2-3 times a day. Move your toes, foot, and ankle often to reduce stiffness and swelling. Raise (elevate) the injured area above the level of your heart while you are sitting or lying down.  Activity Return to your normal activities as told by your health care provider. Ask your health care provider what activities are safe for you. Do not put your full body weight on your ankle until your health care provider says that you can. Do not do any activities that make pain or swelling worse. Do exercises as told by your health care provider. General instructions Take over-the-counter and prescription medicines only as told by your health care provider. Wear supportive shoes or inserts as told by your health care provider. Keep all follow-up visits as told by your health care provider. This is important. How is this prevented? Wear supportive footwear that is appropriate for your athletic activity. Avoid athletic activities that cause pain or swelling in your ankle. See your health   care provider if you have an ankle sprain that causes pain and swelling for more than 2-4 weeks. Do ankle range-of-motion and strengthening exercises as told by your health care provider before beginning any athletic activity. If you start a new athletic  activity, start gradually to build up your strength and flexibility. Contact a health care provider if: Your condition is not getting better after 2-4 weeks of treatment. You cannot put body weight on your ankle without feeling more pain. Summary Chronic ankle instability is a condition that makes the ankle weak and more likely to give way. This condition is caused by multiple ankle sprains that have not healed properly, leaving the ankle ligaments loose or damaged. Treatment includes wearing a boot, brace, or splint, taking medicines for pain and inflammation, and using ice on the affected area. Follow your health care provider's instructions for caring for your ankle during recovery. Contact your health care provider if your ankle does not get better in 2-4 weeks, or if you cannot put weight on your ankle without feeling more pain. This information is not intended to replace advice given to you by your health care provider. Make sure you discuss any questions you have with your healthcare provider. Document Revised: 01/13/2018 Document Reviewed: 01/13/2018 Elsevier Patient Education  2022 [employer]    Ask your health care provider which exercises are safe for you. Do exercises exactly as told by your health care provider and adjust them as directed. It is normal to feel mild stretching, pulling, tightness, or discomfort as you do these exercises. Stop right away if you feel sudden pain or your pain gets worse. Do not begin these exercises until told by your health care provider. Strengthening exercises These exercises build strength and endurance in your ankle. Endurance is theability to use your muscles for a long time, even after they get tired. Ankle eversion Sit on the floor with your legs straight out in front of you. Loop a rubber exercise band around the ball of your left / right foot. The ball of your foot is on the walking surface, right under your toes. Hold the ends of the band  in your hands, or secure the band to a stable object. Slowly push your foot outward, away from your other leg (eversion). Hold this position for 10 seconds. Slowly return your foot to the starting position. Repeat 10 times. Complete this exercise 2 times a day. Heel walking  This exercise strengthens the muscles that push the ankle backward to point your toes toward your knee (ankle dorsiflexors). Walk on your heels for 10 ft. Keep your toes as high as possible. Repeat 10 times. Complete this exercise 2  times per day. Toe walking  This exercise strengthens the muscles that push the ankle forward and your toes downward (ankle plantar flexors). Walk on your toes for 10 ft. Keep your heels as high as possible. Repeat 10  times. Complete this exercise 2  times per day. Balance exercises These exercises improve or maintain your balance. Balance is important inimproving ankle stability and preventing falls. Tandem walking Do this exercise in a hallway or room that is at least 10 ft (3 m) long. Stand with one foot directly in front of the other (tandem). You can use the walls to help you balance if needed, but try not to use them for support. Slowly lift your back foot and place it directly in front of your other foot. Continue to walk in this heel-to-toe way for 10   ft. Repeat 10  times. Complete this exercise 2  times a day. Single leg stand Without shoes, stand near a railing or in a doorway. You may hold on to the railing or door frame as needed for balance. Stand on your left / right foot. Keep your big toe down on the floor and try to keep your arch lifted. If this is too easy, you can try doing it while you do one of these actions: Keep your eyes closed. Stand on a pillow. Throw a ball against a wall and catch it when it returns. Hold this position for 10 seconds. Repeat 10 times. Complete this exercise 2 times a day. Ankle inversion and ankle eversion This exercise is also called  foot rotation with a balance board. It uses a balance board to rotate the foot and ankle inward (inversion) and outward (eversion). Ask your health care provider where you can get a balance board or how you can make one. Stand on a non-carpeted surface near a countertop or wall. Step onto the balance board so your feet are hip width apart. Keep your feet in place, and keep your upper body and hips steady. Using only your feet and ankles to move the board, do the following exercises: Tip the board from side to side as far as you can, alternating between tipping to the left and tipping to the right. Tip the board so it silently taps the floor. Do not let the board forcefully hit the floor. From time to time, pause to hold a steady midway position, with neither the right nor the left sides touching the ground. Tip the board side to side so the board does not hit the floor at all. From time to time, pause to hold a steady midway position. Repeat 10 times, pausing from time to time to hold a steady position.Complete this exercise 2 times a day. Ankle plantar flexion and ankle dorsiflexion This exercise is also called foot flexion with a balance board. It uses a balance board to push the foot downward and away from the leg (plantar flexion) or upward and toward the leg (dorsiflexion). Ask your health care provider where you can get a balance board or how you can make one. Stand on a non-carpeted surface near a countertop or wall. Step onto the balance board so your feet are hip width apart. Keep your feet in place, and keep your upper body and hips steady. Using only your feet and ankles, do the following exercises: Tip the board forward and backward so the board silently taps the floor. Do not let the board forcefully hit the floor. From time to time, pause to hold a steady position midway between touching the floor in front and touching the floor in back. Tip the board forward and backward so the board  does not hit the floor at all. From time to time, pause to hold a steady position. Repeat 10 times, pausing from time to time to hold a steady position.Complete this exercise 2 times a day. This information is not intended to replace advice given to you by your health care provider. Make sure you discuss any questions you have with your healthcare provider. Document Revised: 07/23/2018 Document Reviewed: 01/13/2018 Elsevier Patient Education  2022 [employer] Inc.  

## 2022-03-15 ENCOUNTER — Other Ambulatory Visit: Payer: Self-pay

## 2022-03-15 ENCOUNTER — Emergency Department
Admission: EM | Admit: 2022-03-15 | Discharge: 2022-03-15 | Disposition: A | Discharge status: Home / Self Care | Payer: 59 | Attending: Emergency Medicine | Admitting: Emergency Medicine

## 2022-03-15 DIAGNOSIS — R519 Headache, unspecified: Secondary | ICD-10-CM | POA: Insufficient documentation

## 2022-03-15 DIAGNOSIS — H5712 Ocular pain, left eye: Secondary | ICD-10-CM | POA: Insufficient documentation

## 2022-03-15 MED ORDER — FLUORESCEIN SODIUM 1 MG OP STRP
1.0000 | ORAL_STRIP | Freq: Once | OPHTHALMIC | Status: AC
  Administered 2022-03-15: 1 via OPHTHALMIC
  Filled 2022-03-15: qty 1

## 2022-03-15 MED ORDER — ERYTHROMYCIN 5 MG/GM OP OINT: 1.0000 | TOPICAL_OINTMENT | Freq: Every day | OPHTHALMIC | 0 refills | Status: AC

## 2022-03-15 MED ORDER — TETRACAINE HCL 0.5 % OP SOLN
1.0000 [drp] | Freq: Once | OPHTHALMIC | Status: AC
Start: 2022-03-15 — End: 2022-03-15
  Administered 2022-03-15: 1 [drp] via OPHTHALMIC
  Filled 2022-03-15: qty 4

## 2022-03-15 NOTE — ED Triage Notes (Signed)
Pt comes with c/o possible concussion that happened on Sunday. Pt states he was stretching for his basketball game and had a band that popped up and hit his left eye and head. Pt states his eye hurt for a few days but now having more pain.

## 2022-03-15 NOTE — ED Provider Notes (Signed)
[facility] Provider Note    Event Date/Time   First MD Initiated Contact with Patient 03/15/22 1618     (approximate)   History   Concussion   HPI  Thomas Randolph is a 21 y.o. male presents to the emergency department with left eye pain.  States that he was working out with a stretching band on his left ankle while he was playing basketball and the band snapped and hit his left eye.  Ongoing pain in his left eye since that time.  Photophobia.  Denies any nausea or vomiting.  No change in vision.  Does not wear any contacts or glasses.  No mental fogginess.  States that his coaches told him to come to the emergency department for possible concussion.     Physical Exam   Triage Vital Signs: ED Triage Vitals  Enc Vitals Group     BP 03/15/22 1507 120/70     Pulse Rate 03/15/22 1507 (!) 59     Resp 03/15/22 1507 16     Temp 03/15/22 1507 98.8 F (37.1 C)     Temp Source 03/15/22 1507 Oral     SpO2 03/15/22 1507 100 %     Weight 03/15/22 1507 175 lb (79.4 kg)     Height 03/15/22 1507 5\' 10"  (1.778 m)     Head Circumference --      Peak Flow --      Pain Score 03/15/22 1457 7     Pain Loc --      Pain Edu? --      Excl. in GC? --     Most recent vital signs: Vitals:   03/15/22 1507  BP: 120/70  Pulse: (!) 59  Resp: 16  Temp: 98.8 F (37.1 C)  SpO2: 100%    Physical Exam Constitutional:      Appearance: He is well-developed.  HENT:     Head: Atraumatic.  Eyes:     General: Lids are normal.        Right eye: Foreign body, discharge and hordeolum present.     Extraocular Movements: Extraocular movements intact.     Conjunctiva/sclera:     Left eye: Left conjunctiva is injected.     Pupils: Pupils are equal, round, and reactive to light.     Comments: Negative fluorescein uptake bilaterally  Cardiovascular:     Rate and Rhythm: Regular rhythm.  Pulmonary:     Effort: No respiratory distress.  Musculoskeletal:     Cervical back:  Normal range of motion.  Skin:    General: Skin is warm.  Neurological:     Mental Status: He is alert. Mental status is at baseline.     Comments: Cranial nerves intact          IMPRESSION / MDM / ASSESSMENT AND PLAN / ED COURSE  I reviewed the triage vital signs and the nursing notes.  Differential diagnosis including concussion, corneal abrasion, iritis, posttraumatic headache.  Nonfocal neurologic exam, do not feel that CT head is necessary at this time.  Fluorescein stain without uptake, extraocular movements intact.  No concern for orbital wall fracture or entrapment.  Discussed NSAIDs for pain control.  Clinical picture is not consistent with a glaucoma.  No change in vision.  No direct or consensual photophobia have a low suspicion for an iritis.   Low suspicion for concussion.  Discussed return precautions to support.  Discussed return to the emergency department for any worsening symptoms.  Will give a prescription for erythromycin ointment.  ED Results / Procedures / Treatments   Labs (all labs ordered are listed, but only abnormal results are displayed) Labs interpreted as -    Labs Reviewed - No data to display     PROCEDURES:  Critical Care performed: No  Procedures  Patient's presentation is most consistent with acute illness / injury with system symptoms.   MEDICATIONS ORDERED IN ED: Medications  tetracaine (PONTOCAINE) 0.5 % ophthalmic solution 1 drop (1 drop Right Eye Given 03/15/22 1627)  fluorescein ophthalmic strip 1 strip (1 strip Both Eyes Given 03/15/22 1627)    FINAL CLINICAL IMPRESSION(S) / ED DIAGNOSES   Final diagnoses:  Left eye pain  Nonintractable headache, unspecified chronicity pattern, unspecified headache type     Rx / DC Orders   ED Discharge Orders          Ordered    erythromycin ophthalmic ointment  Daily at bedtime        03/15/22 1706             Note:  This document was prepared using Dragon voice  recognition software and may include unintentional dictation errors.   Nathaniel Man, MD 03/15/22 [phone]

## 2022-03-15 NOTE — Discharge Instructions (Addendum)
You were seen in the emergency department for headache and left eye pain.  No signs of a corneal abrasion.  You can alternate ibuprofen and Tylenol for pain control.  Follow-up with your primary care physician and return to the emergency department if you of any change in vision.

## 2022-04-25 ENCOUNTER — Telehealth: Payer: Self-pay | Admitting: Podiatry

## 2022-04-25 NOTE — Telephone Encounter (Signed)
Pt  was wanting to know if he can get a steroid shot in R foot.  Please advise

## 2022-04-26 ENCOUNTER — Encounter: Payer: Self-pay | Admitting: Podiatry

## 2022-04-30 ENCOUNTER — Ambulatory Visit (INDEPENDENT_AMBULATORY_CARE_PROVIDER_SITE_OTHER): Payer: 59

## 2022-04-30 ENCOUNTER — Ambulatory Visit (INDEPENDENT_AMBULATORY_CARE_PROVIDER_SITE_OTHER): Payer: 59 | Admitting: Podiatry

## 2022-04-30 DIAGNOSIS — M722 Plantar fascial fibromatosis: Secondary | ICD-10-CM | POA: Diagnosis not present

## 2022-04-30 MED ORDER — MELOXICAM 15 MG PO TABS: 15.0000 mg | ORAL_TABLET | Freq: Every day | ORAL | 3 refills | Status: DC

## 2022-05-01 NOTE — Progress Notes (Signed)
  Subjective:  Patient ID: Thomas Randolph, male    DOB: [birth year],  MRN: [id number]  Chief Complaint  Patient presents with   Plantar Fasciitis    follow up 2 months, left feels great, no pain since last injection. Right foot is now painful    22 y.o. male presents with the above complaint. History confirmed with patient.  His left foot is doing well has been able to play basketball is not having any pain here now.  His right foot is now painful and having the same type of issue  Objective:  Physical Exam: warm, good capillary refill, no trophic changes or ulcerative lesions, normal DP and PT pulses, normal sensory exam, no pain on left heel today, he does have pain on palpation to the insertion of the plantar fascia on the right side today  Radiographs: Multiple views x-ray of the left foot: Radiographs taken today show no acute fracture or dislocation, in the area of Kager's triangle there is some radiolucency  New radiographs taken today of the right foot show no significant calcaneal plantar spurring no fracture or stress fracture  [facility] at [facility] 01/02/2022 CONCLUSION: 1. Ill-defined thickened anterior talofibular ligament, calcaneofibular ligament and less so deltoid ligament. Sprain, scarring and capsulitis are present. Clinical correlation will be necessary to assess and confirm functional stability. 2. Mild Achilles tendon tendinopathy and peritendinitis. No tear. 3. Edema changes within the mid and posteromedial talus. Low-grade stress change or contusion present. 4. No fracture, AVN or mass. Thank you for the opportunity to provide your interpretation. Libby Maw, MD A: Jannifer Rodney 01/02/2022 2:09 PM   CT scan 01/23/2022 IMPRESSION: 1. No evidence of acute fracture or dislocation. 2. Small osseous fragment about the inferior aspect of the medial malleolus, likely sequela of prior trauma. 3. Muscles and tendons are intact.     Electronically Signed   By: Keane Police D.O.    On: 01/23/2022 16:39 Assessment:   1. Plantar fasciitis of right foot      Plan:  Patient was evaluated and treated and all questions answered.  His left side is doing very well.  He is being to have Planter fasciitis on the right side.  We again discussed treatment of this he has done very well with an injection and physical therapy on the left side.  Will do the same for the right side.  I refilled his meloxicam as well.  Injection was performed as noted below.  He will return to see me as needed  After sterile prep with povidone-iodine solution and alcohol, the right heel was injected with 0.5cc 2% xylocaine plain, 0.5cc 0.5% marcaine plain, 5mg  triamcinolone acetonide, and 2mg  dexamethasone was injected along the medial plantar fascia at the insertion on the plantar calcaneus. The patient tolerated the procedure well without complication.   Return if symptoms worsen or fail to improve.

## 2022-05-06 ENCOUNTER — Ambulatory Visit: Payer: 59 | Admitting: Podiatry

## 2022-11-18 ENCOUNTER — Encounter: Payer: Self-pay | Admitting: Podiatry

## 2022-11-18 ENCOUNTER — Ambulatory Visit: Payer: 59 | Admitting: Podiatry

## 2022-11-18 ENCOUNTER — Ambulatory Visit (INDEPENDENT_AMBULATORY_CARE_PROVIDER_SITE_OTHER): Payer: 59

## 2022-11-18 DIAGNOSIS — M7752 Other enthesopathy of left foot: Secondary | ICD-10-CM | POA: Diagnosis not present

## 2022-11-18 DIAGNOSIS — S9032XA Contusion of left foot, initial encounter: Secondary | ICD-10-CM

## 2022-11-18 MED ORDER — MELOXICAM 15 MG PO TABS: 15.0000 mg | ORAL_TABLET | Freq: Every day | ORAL | 3 refills | Status: AC

## 2022-11-19 NOTE — Progress Notes (Signed)
  Subjective:  Patient ID: Thomas Randolph, male    DOB: April 23, 2000,  MRN: [id number]  Chief Complaint  Patient presents with   Foot Pain    "My team mate came down on my foot.  It's been hurting since then." N - foot pain  L - dorsal 1st/2nd met left D - 1 week O - suddenly, about the same C - sharp pain A - making quick cuts, side to side T - ice, Ibuprofen, xrays    22 y.o. male presents with the above complaint. History confirmed with patient.  Does not hurt with regular ambulation only on pushoff in the midfoot  Objective:  Physical Exam: warm, good capillary refill, no trophic changes or ulcerative lesions, normal DP and PT pulses, normal sensory exam, and mild tenderness over the midfoot, no disruption of TA tendon, no ecchymosis or edema   Radiographs: Multiple views x-ray of the left foot: no fracture, dislocation, swelling or degenerative changes noted Assessment:   1. Contusion of left foot, initial encounter      Plan:  Patient was evaluated and treated and all questions answered.  Reviewed his x-rays, there is no diastases of the Lisfranc joint, he does have some tenderness here but has not had any erythema ecchymosis or edema only has some discomfort with pushoff.  I discussed within 1 to 2 weeks should resolve should rest from impact activity for this time.  And then may resume full activity.  If worsening will let me know and we will proceed with an MRI.  Rx for meloxicam sent to pharmacy.  Comfortable ambulating in regular shoe gear do not think that boot is necessary at this point.  Return if symptoms worsen or fail to improve.

## 2022-11-22 ENCOUNTER — Ambulatory Visit
Admission: EM | Admit: 2022-11-22 | Discharge: 2022-11-22 | Disposition: A | Discharge status: Home / Self Care | Payer: 59 | Attending: Family Medicine | Admitting: Family Medicine

## 2022-11-22 DIAGNOSIS — Z113 Encounter for screening for infections with a predominantly sexual mode of transmission: Secondary | ICD-10-CM | POA: Diagnosis not present

## 2022-11-22 MED ORDER — CEFTRIAXONE SODIUM 500 MG IJ SOLR
500.0000 mg | Freq: Once | INTRAMUSCULAR | Status: AC
  Administered 2022-11-22: 500 mg via INTRAMUSCULAR

## 2022-11-22 NOTE — ED Triage Notes (Signed)
Pt states he has been having burning with urination and penial discharge for 2 days. No recent STD exposure that he knows of but what's to be tested.

## 2022-11-22 NOTE — ED Provider Notes (Signed)
[facility]    CSN: [id number] Arrival date & time: 11/22/22  1233      History   Chief Complaint Chief Complaint  Patient presents with   SEXUALLY TRANSMITTED DISEASE    HPI Thomas Randolph is a 22 y.o. male.   HPI Presents today for routine STD testing.  He endorses 2 days of dysuria and yesterday noticing a yellowish discharge with urination.  He has not had any abdominal pain, nausea, vomiting or fever.  He has no known knowledge of being exposed to any STDs.  History reviewed. No pertinent past medical history.  There are no problems to display for this patient.   History reviewed. No pertinent surgical history.     Home Medications    Prior to Admission medications   Medication Sig Start Date End Date Taking? Authorizing Provider  meloxicam (MOBIC) 15 MG tablet Take 1 tablet (15 mg total) by mouth daily. 11/18/22  Yes McDonald, Rachelle Hora, DPM  methocarbamol (ROBAXIN) 500 MG tablet Take 500 mg by mouth 4 (four) times daily. Patient not taking: Reported on 12/07/2021    [provider]  methylPREDNISolone (MEDROL DOSEPAK) 4 MG TBPK tablet 6 day dose pack - take as directed 01/16/22   Edwin Cap, DPM  oseltamivir (TAMIFLU) 75 MG capsule Take 75 mg by mouth. Patient not taking: Reported on 12/07/2021    [provider]    Family History History reviewed. No pertinent family history.  Social History Social History   Tobacco Use   Smoking status: Never   Smokeless tobacco: Never  Vaping Use   Vaping status: Never Used  Substance Use Topics   Alcohol use: No    Alcohol/week: 0.0 standard drinks of alcohol   Drug use: Not Currently    Types: Marijuana     Allergies   Patient has no known allergies.   Review of Systems Review of Systems Pertinent negatives listed in HPI   Physical Exam Triage Vital Signs ED Triage Vitals  Encounter Vitals Group     BP 11/22/22 1343 122/72     Systolic BP Percentile --      Diastolic BP  Percentile --      Pulse Rate 11/22/22 1343 98     Resp 11/22/22 1343 18     Temp 11/22/22 1343 98.2 F (36.8 C)     Temp src --      SpO2 11/22/22 1343 97 %     Weight --      Height --      Head Circumference --      Peak Flow --      Pain Score 11/22/22 1346 3     Pain Loc --      Pain Education --      Exclude from Growth Chart --    No data found.  Updated Vital Signs BP 122/72   Pulse 98   Temp 98.2 F (36.8 C)   Resp 18   SpO2 97%   Visual Acuity Right Eye Distance:   Left Eye Distance:   Bilateral Distance:    Right Eye Near:   Left Eye Near:    Bilateral Near:     Physical Exam General appearance: Alert, well developed, well nourished, cooperative  Head: Normocephalic, without obvious abnormality, atraumatic Heart: Rate and rhythm normal. No gallop or murmurs noted on exam  Respiratory: Respirations even and unlabored, normal respiratory rate Extremities: No gross deformities Skin: Skin color, texture, turgor normal. No  rashes seen  Psych: Appropriate mood and affect. Cytology Self-Collected  UC Treatments / Results  Labs (all labs ordered are listed, but only abnormal results are displayed) Labs Reviewed  HIV ANTIBODY (ROUTINE TESTING W REFLEX)  RPR  CYTOLOGY, (ORAL, ANAL, URETHRAL) ANCILLARY ONLY    EKG   Radiology No results found.  Procedures Procedures (including critical care time)  Medications Ordered in UC Medications  cefTRIAXone (ROCEPHIN) injection 500 mg (500 mg Intramuscular Given 11/22/22 1437)    Initial Impression / Assessment and Plan / UC Course  I have reviewed the triage vital signs and the nursing notes.  Pertinent labs & imaging results that were available during my care of the patient were reviewed by me and considered in my medical decision making (see chart for details).    Screen for STD, will cover with Rocephin 500 mg given as patient is symptomatic. Patient advised to use barrier protection with any sexual  encounters while awaiting STD test results.  Patient verbalized understanding and agreement with plan. Final Clinical Impressions(s) / UC Diagnoses   Final diagnoses:  Screening for STD (sexually transmitted disease)     Discharge Instructions      Your lab results will be available within 48-72 hours via Mychart. Our results nurse will call if you need any additional treatment. You were treated with Rocephin injection which cures certain STD and infections of urethra.  Refrain from unprotected sexual intercourse until your lab results are known.      ED Prescriptions   None    PDMP not reviewed this encounter.   Bing Neighbors, NP 11/22/22 [phone]

## 2022-11-22 NOTE — Discharge Instructions (Signed)
Your lab results will be available within 48-72 hours via Mychart. Our results nurse will call if you need any additional treatment. You were treated with Rocephin injection which cures certain STD and infections of urethra.  Refrain from unprotected sexual intercourse until your lab results are known.

## 2022-11-25 ENCOUNTER — Encounter: Payer: Self-pay | Admitting: Podiatry

## 2022-11-25 DIAGNOSIS — S9032XA Contusion of left foot, initial encounter: Secondary | ICD-10-CM

## 2022-11-25 DIAGNOSIS — S93622A Sprain of tarsometatarsal ligament of left foot, initial encounter: Secondary | ICD-10-CM

## 2022-11-26 NOTE — Telephone Encounter (Signed)
Patient having persistent pain  w/ concern for lisfranc injury, MRI ordered for evaluation.

## 2022-12-23 ENCOUNTER — Ambulatory Visit
Admission: RE | Admit: 2022-12-23 | Discharge: 2022-12-23 | Disposition: A | Discharge status: Home / Self Care | Payer: 59 | Source: Ambulatory Visit | Attending: Podiatry | Admitting: Podiatry

## 2022-12-23 DIAGNOSIS — S9032XA Contusion of left foot, initial encounter: Secondary | ICD-10-CM

## 2022-12-23 DIAGNOSIS — S93622A Sprain of tarsometatarsal ligament of left foot, initial encounter: Secondary | ICD-10-CM

## 2023-06-23 ENCOUNTER — Ambulatory Visit
Admission: RE | Admit: 2023-06-23 | Discharge: 2023-06-23 | Disposition: A | Discharge status: Home / Self Care | Source: Ambulatory Visit | Attending: Family Medicine | Admitting: Family Medicine

## 2023-06-23 ENCOUNTER — Ambulatory Visit: Admitting: Family Medicine

## 2023-06-23 ENCOUNTER — Ambulatory Visit
Admission: RE | Admit: 2023-06-23 | Discharge: 2023-06-23 | Disposition: A | Discharge status: Home / Self Care | Attending: Family Medicine | Admitting: Family Medicine

## 2023-06-23 ENCOUNTER — Encounter: Payer: Self-pay | Admitting: Family Medicine

## 2023-06-23 VITALS — BP 116/60 | HR 89 | Ht 70.0 in | Wt 174.0 lb

## 2023-06-23 DIAGNOSIS — M4306 Spondylolysis, lumbar region: Secondary | ICD-10-CM

## 2023-06-23 MED ORDER — VITAMIN D (ERGOCALCIFEROL) 1.25 MG (50000 UNIT) PO CAPS: 50000.0000 [IU] | ORAL_CAPSULE | ORAL | 0 refills | Status: AC

## 2023-06-23 NOTE — Patient Instructions (Signed)
 Patient Plan  Lower Back Pain:  1. Diagnostic Imaging:    - Order X-ray and MRI to evaluate the suspected stress injury/spondylolysis.  2. Activity Modification:    - Advise the patient to avoid activities and positions that exacerbate pain.  3. Supplementation:    - Prescribe a weekly course of prescription Vitamin D.    - Recommend over-the-counter daily calcium supplementation, 2000 mg.  4. Follow-Up:    - Contact the patient with MRI results once they are available.    - Encourage the patient to follow up via MyChart or phone call with any questions or concerns.  Red Flags: - Instruct the patient to seek immediate medical attention if they experience any new symptoms, such as radiating pain, numbness, or significant changes in mobility.

## 2023-06-23 NOTE — Assessment & Plan Note (Addendum)
 History of Present Illness He presents with persistent lower back pain.  He has been experiencing lower back pain for the past two months, which began suddenly during a basketball practice at the end of December. Initially intermittent, the pain has become persistent, occurring daily, especially at night and after prolonged sitting.  The pain is localized to the lower back without radiation to the buttocks or legs. It is described as a tense feeling that worsens when lying down, particularly if not lying on his back. The pain is tolerable during the day but intensifies at night, affecting his sleep. No radiating pain to the buttocks or legs, and no symptoms of pins and needles or numbness.  The pain affects his daily life, being present every night and sometimes when getting up after sitting for a while, such as in class. He has been using ibuprofen and has tried dry needling, which provided temporary relief. KT tape was used during games to relieve pressure, allowing him to play without significant discomfort until the tape was removed. A muscle relaxer prescribed over Christmas through outside provider did not provide significant relief.  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 He has continued to play through the pain during the basketball season, which ended two weeks ago. He is currently on a break and has not engaged in any physical activity since the season ended.  He has not undergone any imaging studies such as x-rays or MRIs. The initial diagnosis was back spasms. He has not experienced any changes in his workout routine or any significant injuries that could have precipitated the pain.  Physical Exam STRENGTH: Resistant left hip flexion elicits back pain. Resistant hip flexion on the right is benign. SPECIAL TESTS: Negative straight leg raise bilaterally. Negative piriformis test bilaterally with decreased mobility and mild tightness bilaterally.  Negative fader and vapor tests on the left. Negative FADIR and FABER tests on the right. Positive stork test on the right, localizing to the left, and positive on the left.  Assessment and Plan Lower Back Pain Progressive, localized lower back pain, exacerbated by certain movements and positions, particularly at night.  This in the setting of collegiate athletics.  No radiating symptoms. Positive stork test on the left. Suspected stress injury to the joints of the spine. -Order X-ray and MRI to further evaluate the suspected stress injury /spondylolysis. -Advise patient to avoid activities and positions that exacerbate pain. -Prescribe a course of Rx weekly vitamin D and recommend over-the-counter daily calcium 2000 mg supplementation. -Will contact patient with MRI results once available. -Advise patient to follow up via MyChart or phone call with any questions or concerns.

## 2023-06-23 NOTE — Progress Notes (Signed)
 Primary Care / Sports Medicine Office Visit  Patient Information:  Patient ID: JAIDYN KUHL, male DOB: 21-Aug-2000 Age: 23 y.o. MRN: [id number]   Thomas Randolph is a pleasant 23 y.o. male presenting with the following:  Chief Complaint  Patient presents with   Back Pain    Low back pain x 2 months. Pain is not constant come and goes with certain movement,  sit to stand, and bending are aggravation factor and lying down at night.. He has been taking Advil for pain but it does not help. NKI. No weakness numbness or tingling.     Vitals:   06/23/23 1114  BP: 116/60  Pulse: 89  SpO2: 97%   Vitals:   06/23/23 1114  Weight: 174 lb (78.9 kg)  Height: 5\' 10"  (1.778 m)   Body mass index is 24.97 kg/m.  No results found.   Independent interpretation of notes and tests performed by another provider:   None  Procedures performed:   None  Pertinent History, Exam, Impression, and Recommendations:   Problem List Items Addressed This Visit     Spondylolysis of lumbar region - Primary   History of Present Illness He presents with persistent lower back pain.  He has been experiencing lower back pain for the past two months, which began suddenly during a basketball practice at the end of December. Initially intermittent, the pain has become persistent, occurring daily, especially at night and after prolonged sitting.  The pain is localized to the lower back without radiation to the buttocks or legs. It is described as a tense feeling that worsens when lying down, particularly if not lying on his back. The pain is tolerable during the day but intensifies at night, affecting his sleep. No radiating pain to the buttocks or legs, and no symptoms of pins and needles or numbness.  The pain affects his daily life, being present every night and sometimes when getting up after sitting for a while, such as in class. He has been using ibuprofen and has tried dry needling, which provided  temporary relief. KT tape was used during games to relieve pressure, allowing him to play without significant discomfort until the tape was removed. A muscle relaxer prescribed over Christmas through outside provider did not provide significant relief.  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 He has continued to play through the pain during the basketball season, which ended two weeks ago. He is currently on a break and has not engaged in any physical activity since the season ended.  He has not undergone any imaging studies such as x-rays or MRIs. The initial diagnosis was back spasms. He has not experienced any changes in his workout routine or any significant injuries that could have precipitated the pain.  Physical Exam STRENGTH: Resistant left hip flexion elicits back pain. Resistant hip flexion on the right is benign. SPECIAL TESTS: Negative straight leg raise bilaterally. Negative piriformis test bilaterally with decreased mobility and mild tightness bilaterally. Negative fader and vapor tests on the left. Negative FADIR and FABER tests on the right. Positive stork test on the right, localizing to the left, and positive on the left.  Assessment and Plan Lower Back Pain Progressive, localized lower back pain, exacerbated by certain movements and positions, particularly at night.  This in the setting of collegiate athletics.  No radiating symptoms. Positive stork test on the left. Suspected stress injury to the joints of the spine. -Order  X-ray and MRI to further evaluate the suspected stress injury /spondylolysis. -Advise patient to avoid activities and positions that exacerbate pain. -Prescribe a course of Rx weekly vitamin D and recommend over-the-counter daily calcium 2000 mg supplementation. -Will contact patient with MRI results once available. -Advise patient to follow up via MyChart or phone call with any questions or concerns.       Relevant  Medications   Vitamin D, Ergocalciferol, (DRISDOL) 1.25 MG (50000 UNIT) CAPS capsule   Other Relevant Orders   DG Lumbar Spine Complete   MR Lumbar Spine Wo Contrast     Orders & Medications Medications:  Meds ordered this encounter  Medications   Vitamin D, Ergocalciferol, (DRISDOL) 1.25 MG (50000 UNIT) CAPS capsule    Sig: Take 1 capsule (50,000 Units total) by mouth every 7 (seven) days. Take for 8 total doses(weeks)    Dispense:  8 capsule    Refill:  0   Orders Placed This Encounter  Procedures   DG Lumbar Spine Complete   MR Lumbar Spine Wo Contrast     No follow-ups on file.     Jerrol Banana, MD, [facility]   Primary Care Sports Medicine Primary Care and Sports Medicine at [facility]

## 2023-07-03 ENCOUNTER — Encounter: Payer: Self-pay | Admitting: Family Medicine

## 2023-07-03 NOTE — Telephone Encounter (Signed)
 Please review.  KP

## 2023-07-17 NOTE — Telephone Encounter (Signed)
 Please review and advise.   JM
